# Patient Record
Sex: Female | Born: 1990 | Hispanic: Yes | Marital: Single
Health system: Midwestern US, Community
[De-identification: ages and names within clinical notes are randomized; demographics above are authoritative.]

## PROBLEM LIST (undated history)

## (undated) MED ORDER — AMOXICILLIN-POT CLAVULANATE 400 MG-57 MG/5 ML ORAL SUSP
400-57 mg/5 mL | ORAL | Status: DC
Start: ? — End: 2014-03-31

## (undated) MED ORDER — AMOXICILLIN CLAVULANATE 875 MG-125 MG TAB
875-125 mg | ORAL_TABLET | Freq: Two times a day (BID) | ORAL | Status: AC
Start: ? — End: 2012-08-08

---

## 2012-01-01 LAB — HCG URINE, QL: HCG urine, QL: NEGATIVE

## 2012-01-01 MED ORDER — IBUPROFEN 600 MG TAB
600 mg | ORAL | Status: DC
Start: 2012-01-01 — End: 2012-01-01

## 2012-01-01 MED ORDER — IBUPROFEN 600 MG TAB
600 mg | ORAL_TABLET | Freq: Four times a day (QID) | ORAL | Status: DC | PRN
Start: 2012-01-01 — End: 2014-03-31

## 2012-01-01 NOTE — ED Notes (Signed)
Pain to left foot after wood fell on it last week and today a lap top fell on it

## 2012-01-01 NOTE — ED Notes (Signed)
Pt seen & evaluated by Marcelle Smiling NP, Motrin 600mg  PO given & tolerated, x-ray ordered, pending urine HCG results, no visible distress noted.

## 2012-01-01 NOTE — ED Notes (Signed)
Ace bandage applied  Discharge in stable condition, f/u care instructions given, verbalize understanding, no visible distress.

## 2012-01-01 NOTE — ED Provider Notes (Addendum)
HPI Comments: Patient reports that a piece of wood fell onto her right foot a week ago and had pain at that time. She reports that her laptop fell onto her right foot this am. Reports pain 4/10. To right foot. Denies weakness, numbness or tingling.     Patient is a 21 y.o. female presenting with foot injury. The history is provided by the patient.   Foot Injury   This is a new problem. The pain is at a severity of 6/10. Pertinent negatives include no numbness, full range of motion, no stiffness, no tingling, no itching, no back pain and no neck pain.        Past Medical History   Diagnosis Date   ??? Psychiatric disorder    ??? Anxiety    ??? Depression         History reviewed. No pertinent past surgical history.      History reviewed. No pertinent family history.     History     Social History   ??? Marital Status: SINGLE     Spouse Name: N/A     Number of Children: N/A   ??? Years of Education: N/A     Occupational History   ??? Not on file.     Social History Main Topics   ??? Smoking status: Former Smoker   ??? Smokeless tobacco: Not on file   ??? Alcohol Use: Yes   ??? Drug Use: No   ??? Sexually Active:      Other Topics Concern   ??? Not on file     Social History Narrative   ??? No narrative on file                  ALLERGIES: Prednisone      Review of Systems   HENT: Negative for neck pain.    Respiratory: Negative.    Cardiovascular: Negative.    Gastrointestinal: Negative.    Musculoskeletal: Positive for myalgias and arthralgias. Negative for back pain, joint swelling, gait problem and stiffness.   Skin: Positive for wound. Negative for color change, itching, pallor and rash.        Small abrasion to dorsal aspect of right foot    Neurological: Negative.  Negative for tingling and numbness.       Filed Vitals:    01/01/12 1205   BP: 113/74   Pulse: 78   Temp: 98.1 ??F (36.7 ??C)   Resp: 16   Height: 5\' 3"  (1.6 m)   Weight: 90.719 kg (200 lb)   SpO2: 98%            Physical Exam   Constitutional: She is oriented to person, place,  and time.   Pulmonary/Chest: Effort normal.   Abdominal: Soft.   Musculoskeletal: She exhibits tenderness. She exhibits no edema.   Neurological: She is alert and oriented to person, place, and time. She displays normal reflexes. No cranial nerve deficit. Coordination normal.   Skin: Skin is warm.        MDM     Amount and/or Complexity of Data Reviewed:    Obtain history from someone other than the patient:  No      Procedures    <EMERGENCY DEPARTMENT CASE SUMMARY>    Impression/Differential Diagnosis: right foot pain    Plan: xray right foot     ED Course: d/c with motrin and f/u with Orthopedic MD if pain persists     Final Impression/Diagnosis: right foot pain  Patient condition at time of disposition:stable       I have reviewed the following home medications:    Prior to Admission medications    Medication Sig Start Date End Date Taking? Authorizing Provider   citalopram (CELEXA) 10 mg tablet Take  by mouth daily.   Yes Phys Other, MD   QUEtiapine (SEROQUEL) 25 mg tablet Take  by mouth daily.   Yes Phys Other, MD         Mickel Duhamel, NP

## 2012-01-01 NOTE — ED Notes (Signed)
X-rays done & read.

## 2012-01-19 NOTE — ED Provider Notes (Signed)
I was personally available for consultation in the emergency department.  I have reviewed the chart and agree with the documentation recorded by the MLP, including the assessment, treatment plan, and disposition.  Braley Luckenbaugh R. Montasia Chisenhall, MD

## 2012-07-28 NOTE — ED Provider Notes (Addendum)
HPI Comments: Patient to ED complains of generalized body aches, dry hacking cough, head and chest congestion, and sore throat for the past 3 weeks.   Patient was initially seen by her primary MD for URI symptoms, was prescribed cough syrup and told she had a cold.  Patient completed the bottle of cough syrup, did not improve and in fact feels worse.     Patient is a 22 y.o. female presenting with cough and sore throat. The history is provided by the patient.   Cough  This is a new problem. The current episode started more than 1 week ago. The problem has not changed since onset.The cough is non-productive. There has been a fever of 100 - 100.9 F. Associated symptoms include chest pain, ear pain, headaches, rhinorrhea, sore throat and myalgias. Pertinent negatives include no chills, no sweats, no eye redness, no ear congestion, no shortness of breath, no nausea and no vomiting. She has tried cough syrup for the symptoms. The treatment provided no relief. She is not a smoker.   Sore Throat   This is a new problem. The current episode started more than 2 days ago. The problem has been gradually worsening. There has been a fever of 100 - 100.9 F. Associated symptoms include congestion, ear pain, headaches, swollen glands and cough. Pertinent negatives include no vomiting, no drooling, no shortness of breath, no stridor, no trouble swallowing and no stiff neck. She has had no exposure to strep or mono. She has tried NSAIDs for the symptoms. The treatment provided mild relief.        Past Medical History   Diagnosis Date   ??? Psychiatric disorder    ??? Anxiety    ??? Depression         History reviewed. No pertinent past surgical history.      History reviewed. No pertinent family history.     History     Social History   ??? Marital Status: SINGLE     Spouse Name: N/A     Number of Children: N/A   ??? Years of Education: N/A     Occupational History   ??? Not on file.     Social History Main Topics   ??? Smoking status: Former  Smoker   ??? Smokeless tobacco: Not on file   ??? Alcohol Use: Yes   ??? Drug Use: No   ??? Sexually Active:      Other Topics Concern   ??? Not on file     Social History Narrative   ??? No narrative on file                  ALLERGIES: Prednisone      Review of Systems   Constitutional: Positive for fever, appetite change and fatigue. Negative for chills, diaphoresis and activity change.   HENT: Positive for ear pain, congestion, sore throat, rhinorrhea, postnasal drip and sinus pressure. Negative for hearing loss, facial swelling, drooling, mouth sores, trouble swallowing, neck pain, neck stiffness, voice change and tinnitus.    Eyes: Negative for redness.   Respiratory: Positive for cough. Negative for shortness of breath and stridor.    Cardiovascular: Positive for chest pain.   Gastrointestinal: Positive for abdominal pain. Negative for nausea, vomiting and abdominal distention.        Generalized abdominal cramping    Genitourinary: Negative for dysuria and flank pain.   Musculoskeletal: Positive for myalgias. Negative for back pain and joint swelling.   Skin: Negative.  Allergic/Immunologic: Negative for immunocompromised state.   Neurological: Positive for headaches. Negative for dizziness and weakness.   All other systems reviewed and are negative.        Filed Vitals:    07/28/12 2012   BP: 123/73   Pulse: 90   Temp: 100.6 ??F (38.1 ??C)   Resp: 18   Height: 5\' 3"  (1.6 m)   Weight: 88.451 kg (195 lb)   SpO2: 100%            Physical Exam   Nursing note and vitals reviewed.  Constitutional: She is oriented to person, place, and time. She appears well-developed and well-nourished. No distress.   HENT:   Head: Normocephalic and atraumatic.   Right Ear: External ear normal.   Left Ear: External ear normal.   Nose: Nose normal.   Mouth/Throat: Uvula is midline and mucous membranes are normal. Oropharyngeal exudate, posterior oropharyngeal erythema and tonsillar abscesses present. No posterior oropharyngeal edema.   Eyes:  Conjunctivae and EOM are normal. Pupils are equal, round, and reactive to light. No scleral icterus.   Neck: Trachea normal, normal range of motion and phonation normal. Neck supple. No spinous process tenderness and no muscular tenderness present. No tracheal deviation present.   Cardiovascular: Normal rate, regular rhythm, intact distal pulses and normal pulses.    Pulmonary/Chest: Effort normal and breath sounds normal. No accessory muscle usage or stridor. No respiratory distress. She has no decreased breath sounds. She has no wheezes. She has no rhonchi. She has no rales. She exhibits no tenderness.   Abdominal: Soft. Normal appearance and bowel sounds are normal. She exhibits no distension and no mass. There is generalized tenderness. There is no rebound, no guarding and no CVA tenderness.   Musculoskeletal: Normal range of motion. She exhibits no edema and no tenderness.   Lymphadenopathy:        Head (right side): Submandibular and tonsillar adenopathy present.        Head (left side): Submandibular and tonsillar adenopathy present.     She has no cervical adenopathy.   Neurological: She is alert and oriented to person, place, and time. She has normal strength. No cranial nerve deficit or sensory deficit. GCS eye subscore is 4. GCS verbal subscore is 5. GCS motor subscore is 6.   Skin: Skin is warm and dry. No rash noted. She is not diaphoretic.   Psychiatric: She has a normal mood and affect. Her speech is normal and behavior is normal. Judgment and thought content normal. Cognition and memory are normal.        MDM     Amount and/or Complexity of Data Reviewed:   Clinical lab tests:  Ordered and reviewed  Tests in the radiology section of CPT??:  Ordered   Decide to obtain previous medical records or to obtain history from someone other than the patient:  Yes   Obtain history from someone other than the patient:  Yes   Review and summarize past medical records:  Yes      Procedures    <EMERGENCY DEPARTMENT  CASE SUMMARY>    Impression/Differential Diagnosis:  Strep pharyngitis, mono, pneumonia, flu, viral syndrome    Plan:  u preg, strep- rapid and throat c/s, flu swab, mono spot, Ebstein Barr panel     ED Course: Patient feels somewhat better in ED. Awaiting mono spot results.   11:16 PM  Report to [employer] PA. If mono spot negative will treat for strep pharyngitis based on clinical assessment  Final Impression/Diagnosis: TBD    Patient condition at time of disposition: TBD       I have reviewed the following home medications:    Prior to Admission medications    Medication Sig Start Date End Date Taking? Authorizing Provider   citalopram (CELEXA) 10 mg tablet Take  by mouth daily.   Yes Phys Other, MD   QUEtiapine (SEROQUEL) 25 mg tablet Take  by mouth daily.   Yes Phys Other, MD   ibuprofen (MOTRIN) 600 mg tablet Take 1 Tab by mouth every six (6) hours as needed for Pain. 01/01/12  Yes Mickel Duhamel, NP         Levonne Hubert, NP      Case reviewed with Dr. Nicholos Johns  who agrees with the presented plan, treatment, and disposition.     I was personally available for consultation in the emergency department.  I have reviewed the chart and agree with the documentation recorded by the [facility], including the assessment, treatment plan, and disposition.  Agustin Cree, MD

## 2012-07-28 NOTE — ED Notes (Signed)
White patches noted throat. NAD noted.

## 2012-07-28 NOTE — ED Notes (Signed)
Pt sore throat temp, boby aches and cough for about 3 weeks

## 2012-07-29 LAB — STREP AG SCREEN, GROUP A: Group A Strep Ag ID: NEGATIVE

## 2012-07-29 LAB — INFLUENZA A & B AG (RAPID TEST)
Comment: NEGATIVE
Influenza A Ag: NEGATIVE
Influenza B Ag: NEGATIVE

## 2012-07-29 LAB — MONONUCLEOSIS SCREEN: Mononucleosis screen: NONREACTIVE

## 2012-07-29 NOTE — ED Notes (Signed)
Pt is discharged to home. No distress observed. Denies pain. Instructions reviewed and verbalized understanding. Safety maintained.

## 2012-07-29 NOTE — ED Notes (Cosign Needed)
Labs:  Recent Results (from the past 12 hour(s))   INFLUENZA A & B AG (RAPID TEST)    Collection Time     07/28/12  9:15 PM       Result Value Range    Influenza A Ag NEGATIVE       Influenza B Ag NEGATIVE       Method IMMUNOCHROMATOGRAPHIC MEMBRANE ASSAY      Comment Result: Negative for Influenza A and B     STREP AG SCREEN, GROUP A    Collection Time     07/28/12  9:15 PM       Result Value Range    Group A Strep Ag ID NEGATIVE       Method IMMUNOCHROMATOGRAPHIC MEMBRANE ASSAY     INFECTIOUS MONO SCREEN    Collection Time     07/28/12 11:30 PM       Result Value Range    Mononucleosis screen NONREACTIVE  NONREACTIVE       EKG:    Radiology:     Re-evaluations:    Consults:    <EMERGENCY DEPARTMENT CASE SUMMARY>    Impression/Differential Diagnosis: strep pharyngitis vs. Mono vs. Pneumonia vs. Viral syndrome    Plan: discharged, follow up with pcp, given augmentin RX    ED Course: u preg, rapid strep-neg, rapid flu - neg, mono spot - neg, ebv panel, patient to be discharged    Final Impression/Diagnosis: pharyngitis    Patient condition at time of disposition: stable      I have reviewed the following home medications:    Prior to Admission medications    Medication Sig Start Date End Date Taking? Authorizing Provider   citalopram (CELEXA) 10 mg tablet Take  by mouth daily.   Yes Phys Other, MD   QUEtiapine (SEROQUEL) 25 mg tablet Take  by mouth daily.   Yes Phys Other, MD   ibuprofen (MOTRIN) 600 mg tablet Take 1 Tab by mouth every six (6) hours as needed for Pain. 01/01/12  Yes Mickel Duhamel, NP       Sandford Craze, PA

## 2012-07-29 NOTE — ED Notes (Signed)
Pt remained on stretcher. No distress observed. Awaiting for disposition. Safety maintained.

## 2012-07-29 NOTE — ED Notes (Cosign Needed)
Patient cannot tolerate swallowing pill therefore I wrote a prescription for liquid augmentin.    Judah Carchi RPA-C

## 2012-07-30 LAB — EPSTEIN BARR VIRUS AB PANEL
EBV Ab VCA, IgG: >8 AI — ABNORMAL HIGH (ref 0.0–0.8)
EBV Ab VCA, IgM: <0.2 AI (ref 0.0–0.8)
EBV Early Antigen Ab, IgG: <0.2 AI (ref 0.0–0.8)
EBV Nuclear Antigen Ab, IgG: 1.5 AI — ABNORMAL HIGH (ref 0.0–0.8)

## 2012-07-31 LAB — THROAT CULTURE

## 2012-07-31 NOTE — Progress Notes (Signed)
Quick Note:    Patient on augmentin for strep.  ______

## 2014-03-31 ENCOUNTER — Inpatient Hospital Stay
Admit: 2014-03-31 | Discharge: 2014-04-01 | Disposition: A | Discharge status: Home / Self Care | Payer: Medicaid HMO | Attending: Internal Medicine

## 2014-03-31 ENCOUNTER — Emergency Department: Admit: 2014-04-01 | Payer: Medicaid HMO

## 2014-03-31 DIAGNOSIS — O2392 Unspecified genitourinary tract infection in pregnancy, second trimester: Secondary | ICD-10-CM
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
## 2014-03-31 NOTE — ED Notes (Signed)
Patient felt better. Discharged with instructions.

## 2014-04-01 LAB — URINALYSIS W/ RFLX MICROSCOPIC
Bilirubin: NEGATIVE
Blood: NEGATIVE
Glucose: NEGATIVE mg/dL
Ketone: 160 mg/dL — AB
Nitrites: NEGATIVE
Protein: NEGATIVE mg/dL
Specific gravity: 1.024 (ref 1.003–1.030)
Urobilinogen: 1 EU/dL (ref 0.2–1.0)
pH (UA): 6 (ref 4.6–8.0)

## 2014-04-01 MED ORDER — NITROFURANTOIN (25% MACROCRYSTAL FORM) 100 MG CAP
100 mg | ORAL_CAPSULE | Freq: Two times a day (BID) | ORAL | Status: AC
Start: 2014-04-01 — End: 2014-04-05

## 2014-04-01 MED ORDER — NITROFURANTOIN (25% MACROCRYSTAL FORM) 100 MG CAP
100 mg | Freq: Two times a day (BID) | ORAL | Status: DC
Start: 2014-04-01 — End: 2014-04-01
  Administered 2014-04-01: 01:00:00 via ORAL

## 2014-04-01 MED FILL — NITROFURANTOIN (25% MACROCRYSTAL FORM) 100 MG CAP: 100 mg | ORAL | Qty: 1

## 2014-04-02 LAB — CULTURE, URINE
Culture result:: <10000
Culture: <10000

## 2015-06-28 IMAGING — US US OB TRANSVAGINAL
1 series · 14 of 28 positions shown · non-contrast
Comparison: None.

CLINICAL DATA: Pelvic pain. Back pain. 6 week 0 day gestational age
by LMP.

EXAM:
OBSTETRIC <14 WK US AND TRANSVAGINAL OB US
TECHNIQUE: Both transabdominal and transvaginal ultrasound examinations were
performed for complete evaluation of the gestation as well as the
maternal uterus, adnexal regions, and pelvic cul-de-sac.
Transvaginal technique was performed to assess early pregnancy.

[Series 1: us ob comp less 14 wks · 14 of 42 slices shown]
[im 2/42]
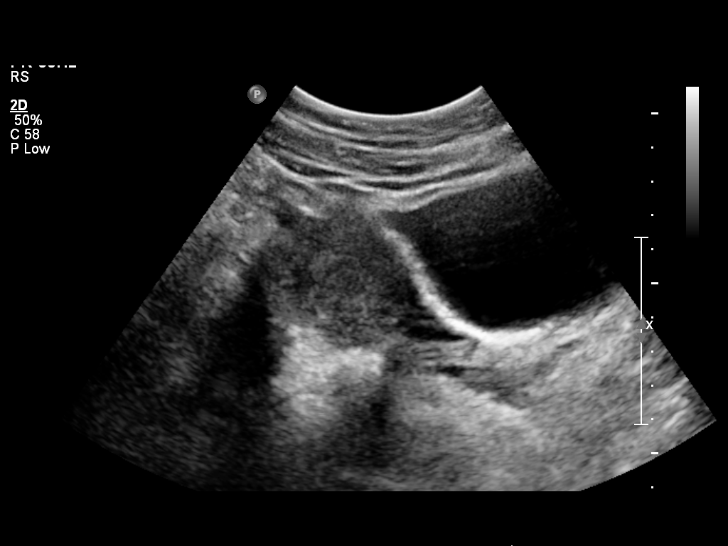
[im 5/42]
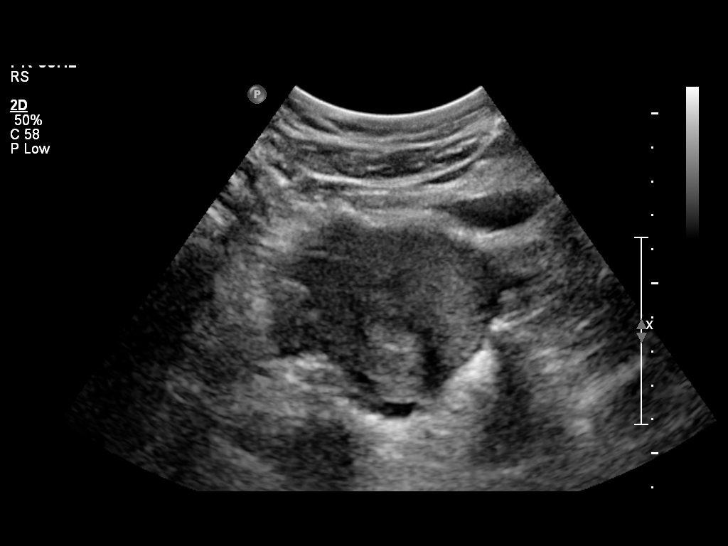
[im 8/42]
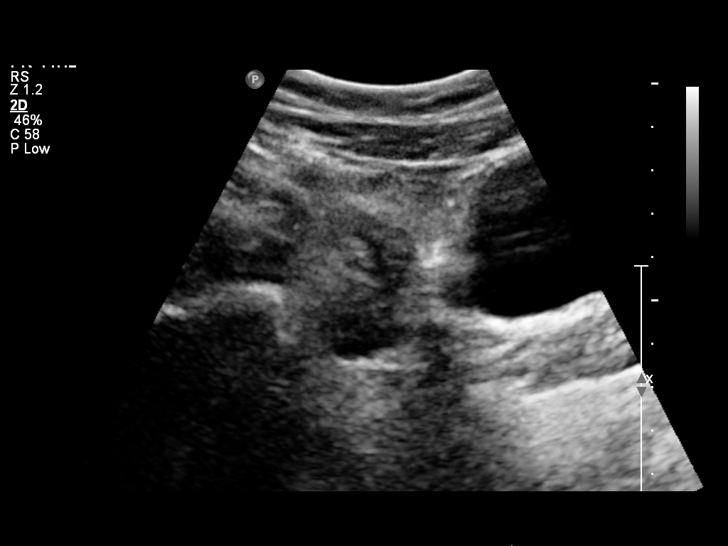
[im 11/42]
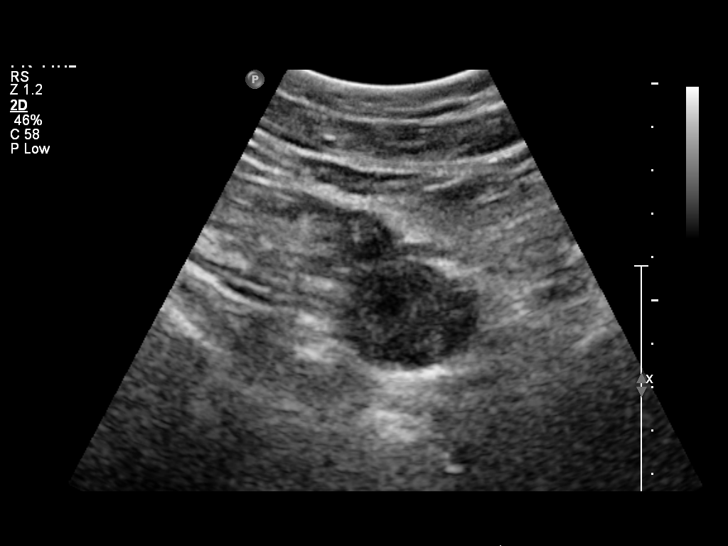
[im 14/42]
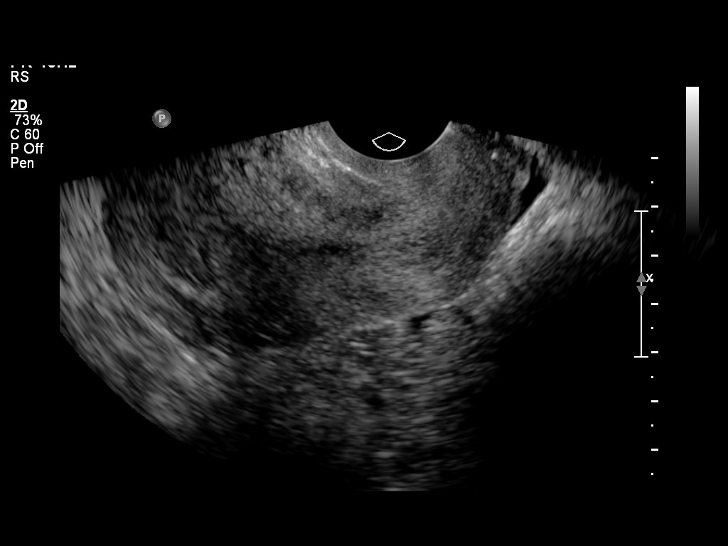
[im 17/42]
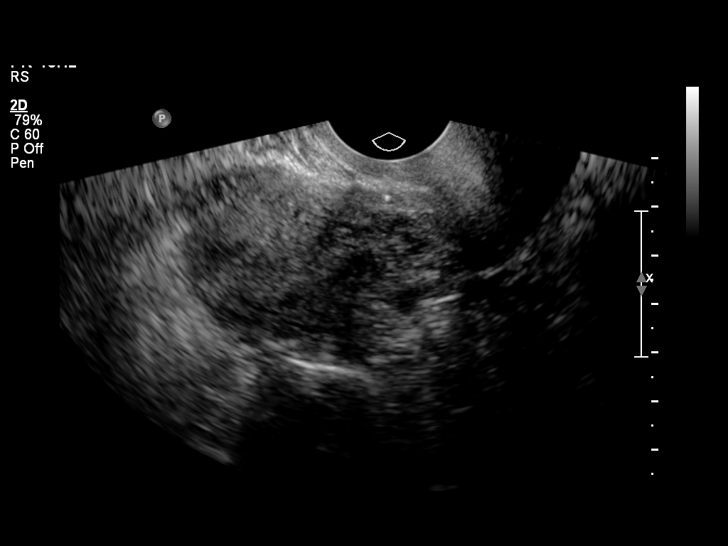
[im 20/42]
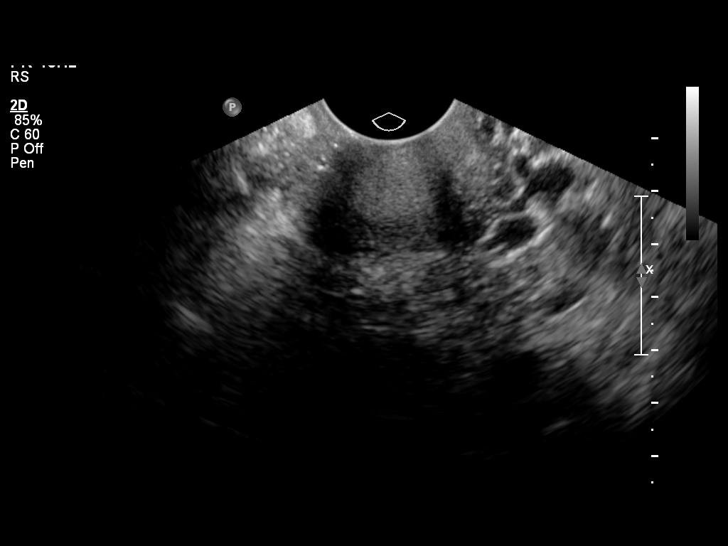
[im 23/42]
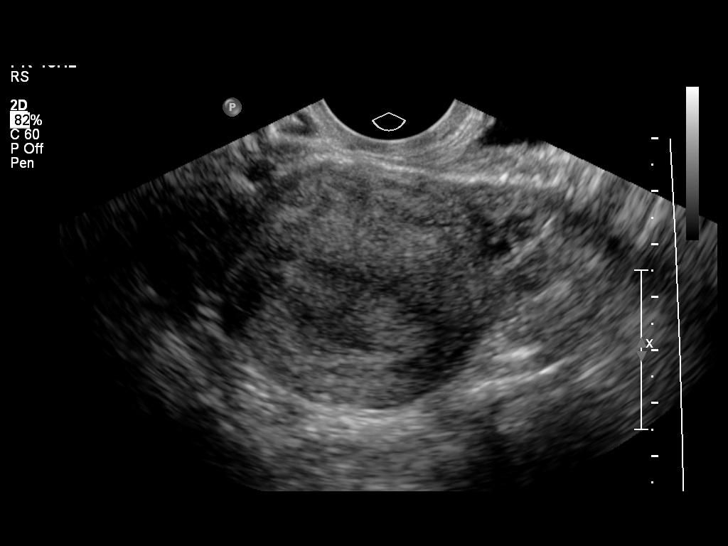
[im 26/42]
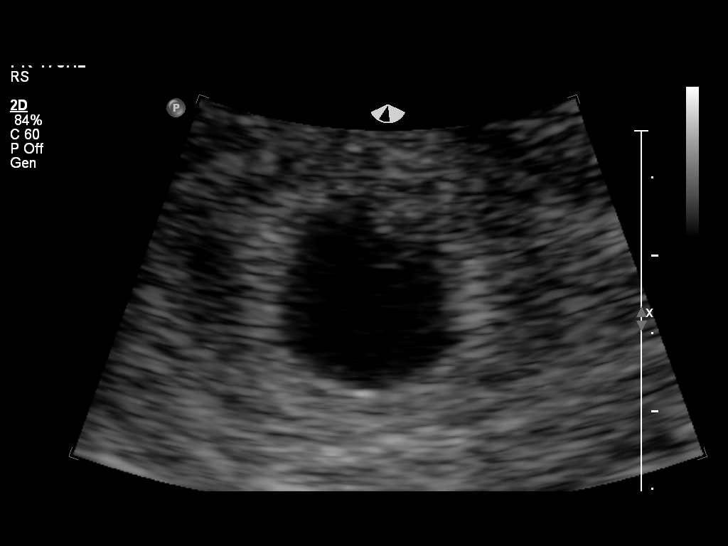
[im 29/42]
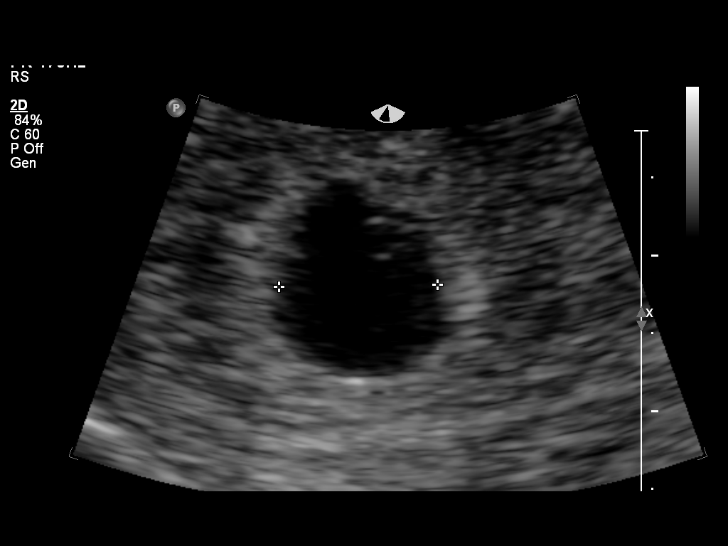
[im 32/42]
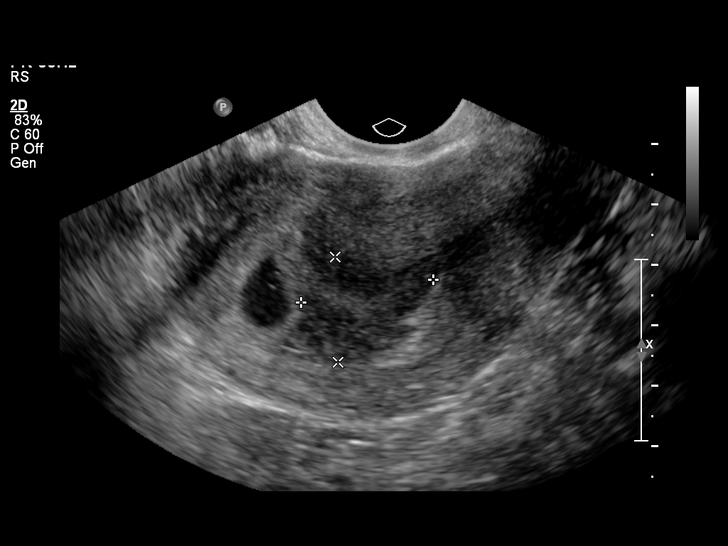
[im 35/42]
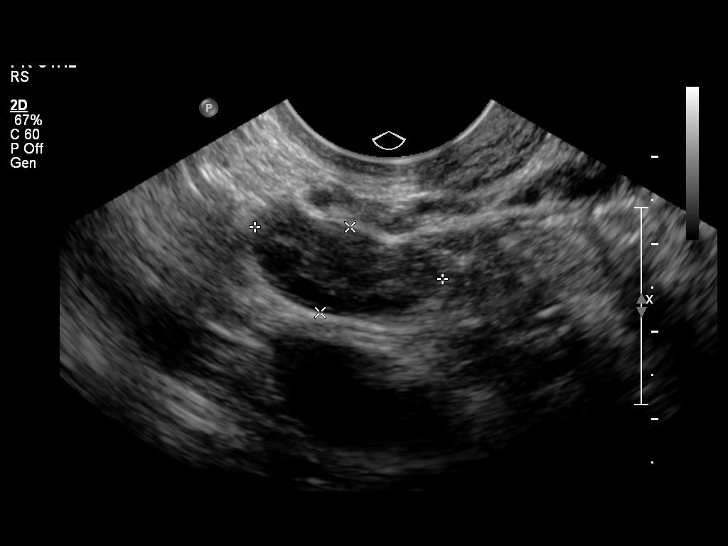
[im 38/42]
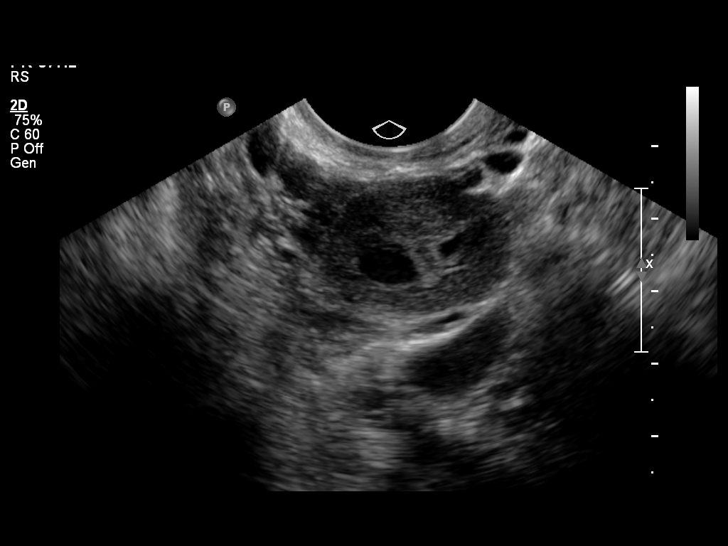
[im 42/42]
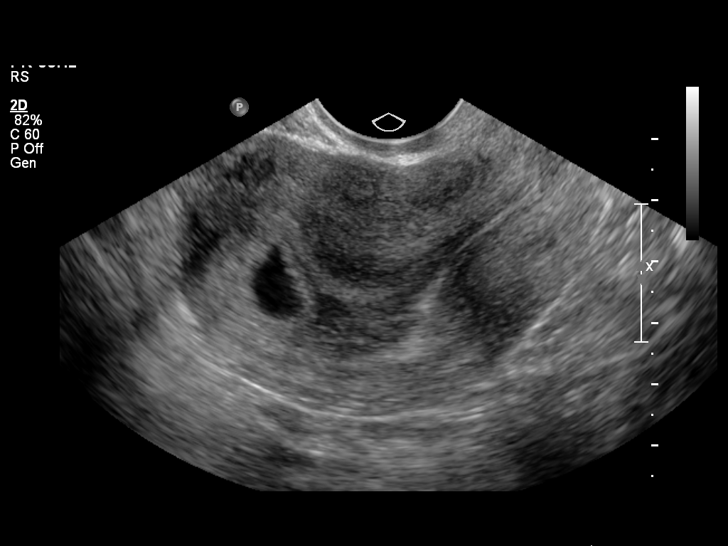

[14 of 28 positions shown; findings below may reference images not displayed]

FINDINGS: Intrauterine gestational sac: Visualized/normal in shape.

Yolk sac:  Visualized

Embryo:  Not visualized

MSD:  11  mm   5 w   5  d

Maternal uterus/adnexae: No fibroids or other significant
abnormality seen involving the uterus. Both ovaries are normal in
appearance. No adnexal mass or free fluid identified.
IMPRESSION: Single intrauterine gestational sac measuring 5 weeks 5 days by mean
sac diameter, which is concordant with LMP.

No significant maternal uterine or adnexal abnormality identified.

## 2021-08-22 ENCOUNTER — Emergency Department: Admit: 2021-08-23 | Payer: Medicaid HMO | Primary: Family Medicine

## 2021-08-22 ENCOUNTER — Inpatient Hospital Stay
Admit: 2021-08-22 | Discharge: 2021-08-23 | Disposition: A | Discharge status: Home / Self Care | Payer: Medicaid HMO | Attending: Emergency Medicine

## 2021-08-22 DIAGNOSIS — N83202 Unspecified ovarian cyst, left side: Secondary | ICD-10-CM

## 2021-08-22 DIAGNOSIS — N8302 Follicular cyst of left ovary: Secondary | ICD-10-CM

## 2021-08-22 MED ORDER — SODIUM CHLORIDE 0.9% BOLUS IV
0.9 % | Freq: Once | INTRAVENOUS | Status: AC
Start: 2021-08-22 — End: 2021-08-22
  Administered 2021-08-22: via INTRAVENOUS

## 2021-08-22 MED FILL — SODIUM CHLORIDE 0.9 % IV: INTRAVENOUS | Qty: 1000

## 2021-08-22 NOTE — ED Notes (Signed)
 Pt s/p eval & tx by provider  Cyst of left ovary. Pt medicated as ordered with no adverse reaction noted. Pt reports  noted pain relief . Discharge Dispo given to pt and reviewed . Pt verbalized understanding of instructions provided. Pt ambulated off unit with family. No s/s of distress upon departure.

## 2021-08-22 NOTE — ED Provider Notes (Signed)
ED Provider Notes by Sonnie Alamo, FNP at 08/22/21 1806                Author: Sonnie Alamo, FNP  Service: Emergency Medicine  Author Type: Nurse Practitioner       Filed: 08/22/21 2124  Date of Service: 08/22/21 1806  Status: Attested           Editor: Sonnie Alamo, [place] (Nurse Practitioner)  Cosigner: Johnnye Sima, DO at 08/29/21 1517          Attestation signed by Johnnye Sima, DO at 08/29/21 [phone]          I was available for consultation in the department at the time of evaluation and reviewed the chart of the midlevel provider per institutional protocol.   Johnnye Sima, DO                                 6:06 PM: Regina Silva is a 31 y.o. female with h/o anxiety, depression, overactive bladder, psychiatric disorder who presents to the ED for evaluation of left sided pelvic pain  and nausea x2 weeks.  Reports had a miscarriage about 4 weeks ago.  State had an IUD which was taken out yesterday.  Denies fever, chills, vomiting, diarrhea, chest pain, shortness of breath or other complaints.        Past Medical History:        Diagnosis  Date         ?  Anxiety       ?  Depression       ?  Overactive bladder           ?  Psychiatric disorder             History reviewed. No pertinent surgical history.        Social History          Tobacco Use         ?  Smoking status:  Former     ?  Smokeless tobacco:  Not on file       Substance Use Topics         ?  Alcohol use:  Yes             Allergies        Allergen  Reactions         ?  Prednisone  Other (comments)             psychosis              Review of Systems    Constitutional:  Positive for activity change and appetite change . Negative for chills, diaphoresis, fatigue, fever and unexpected weight change.    Eyes:  Negative for visual disturbance.    Respiratory:  Negative for cough, chest tightness and shortness of breath.     Cardiovascular:  Negative for chest pain, palpitations and leg swelling.    Gastrointestinal:  Positive for  abdominal pain and nausea . Negative for abdominal distention, anal bleeding, blood in stool, constipation, diarrhea, rectal pain and vomiting.    Genitourinary:  Negative for dysuria.    Musculoskeletal:  Negative for arthralgias, back pain, gait problem, joint swelling, myalgias, neck pain and neck stiffness.    Skin:  Negative for color change, pallor, rash and wound.    Neurological:  Negative for weakness and numbness.  Visit Vitals      BP  109/74     Pulse  (!) 56     Temp  97.1 ??F (36.2 ??C)     Resp  16     Ht  '5\' 3"'  (1.6 m)     Wt  81.6 kg (180 lb)     SpO2  99%        BMI  31.89 kg/m??           Physical Exam   Vitals and nursing note reviewed.    Constitutional:        General: She is awake. She is not in acute distress.      Appearance: Normal appearance. She is well-developed. She is not ill-appearing, toxic-appearing or diaphoretic.       Interventions: She is not intubated.   HENT:       Head: Normocephalic and atraumatic.       Mouth/Throat:       Mouth: Mucous membranes are moist.    Eyes:       Extraocular Movements: Extraocular movements intact.       Pupils: Pupils are equal, round, and reactive to light.    Neck:       Trachea: Trachea and phonation normal.    Cardiovascular:       Rate and Rhythm: Normal rate and regular rhythm.       Pulses: Normal pulses. No decreased pulses.       Heart sounds: Normal heart sounds. No murmur heard.   Pulmonary:       Effort: Pulmonary effort is normal. No tachypnea, bradypnea, accessory muscle usage, prolonged expiration, respiratory distress or retractions. She is not intubated.       Breath sounds: Normal breath sounds and air entry . No stridor, decreased air movement or transmitted upper airway sounds.     Abdominal:       General: Bowel sounds are normal. There is no distension.       Palpations: Abdomen is soft. There is no mass.       Tenderness: There is abdominal tenderness in the left lower quadrant . There is no right CVA tenderness, left CVA  tenderness, guarding or rebound.       Hernia: No hernia is present.     Musculoskeletal:          General: Normal range of motion.       Cervical back: Full passive range of motion without pain, normal range of motion and neck supple. No edema, erythema, signs of trauma, rigidity, torticollis or crepitus. No pain with movement, spinous process tenderness or muscular tenderness. Normal  range of motion.    Skin:      General: Skin is warm and dry.       Capillary Refill: Capillary refill takes less than 2 seconds.       Findings: No rash.    Neurological:       General: No focal deficit present.       Mental Status: She is alert and oriented to person, place, and time. Mental status is at baseline.       GCS: GCS eye subscore is 4. GCS verbal subscore is 5 . GCS motor subscore is 6.       Cranial Nerves: Cranial nerves 2-12 are intact. No cranial nerve deficit.       Sensory: Sensation is intact. No sensory deficit.       Motor: Motor function is intact.  Coordination: Coordination is intact.       Gait: Gait is intact.    Psychiatric:          Attention and Perception: Attention and perception normal.          Mood and Affect: Mood and affect normal.          Speech: Speech normal.          Behavior: Behavior normal. Behavior is cooperative.          Thought Content: Thought content normal.          Cognition and Memory: Cognition and memory normal.          Judgment: Judgment normal.           Labs:     Recent Results (from the past 24 hour(s))     METABOLIC PANEL, COMPREHENSIVE          Collection Time: 08/22/21  6:28 PM         Result  Value  Ref Range            Sodium  140  136 - 145 mmol/L       Potassium  4.4  3.4 - 5.1 mmol/L       Chloride  106  98 - 107 mmol/L       CO2  28  20.0 - 31.0 mmol/L       Anion gap  11  10 - 20 mmol/L       Glucose  78  74 - 106 mg/dL       BUN  17  9 - 23 mg/dL       Creatinine  0.71  0.55 - 1.02 mg/dL       GFR est AA  >60  >60 ml/min/1.51m       GFR est non-AA  >60   >60 ml/min/1.73m      Calcium  9.0  8.7 - 10.4 mg/dL       Bilirubin, total  0.2 (L)  0.3 - 1.2 mg/dL       ALT (SGPT)  12  10 - 49 U/L       AST (SGOT)  13  0 - 33.9 U/L       Alk. phosphatase  43 (L)  46 - 116 U/L       Protein, total  6.5  5.7 - 8.2 g/dL       Albumin  3.6  3.48 - 5.0 g/dL       Globulin  2.9  1.7 - 4.7 g/dL       A-G Ratio  1.2  0.7 - 2.8         LIPASE          Collection Time: 08/22/21  6:28 PM         Result  Value  Ref Range            Lipase  35  12 - 53 U/L       CBC WITH AUTOMATED DIFF          Collection Time: 08/22/21  6:28 PM         Result  Value  Ref Range            WBC  6.8  4.8 - 10.6 K/uL       RBC  4.14 (L)  4.20 - 5.40 M/uL       HGB  12.8  12.0 - 16.0  g/dL       HCT  39.2  36.0 - 47.0 %       MCV  94.7 (H)  81.0 - 94.0 FL       MCH  30.9  27.0 - 35.0 PG       MCHC  32.7  30.7 - 37.3 g/dL       RDW  12.2  11.5 - 14.0 %       PLATELET  246  130 - 400 K/uL       MPV  10.4  9.2 - 11.8 FL       NRBC  0.0  0 PER 100 WBC       ABSOLUTE NRBC  0.00  0.0 - 0.01 K/uL       NEUTROPHILS  53  48.0 - 72.0 %       LYMPHOCYTES  38  18.0 - 40.0 %       MONOCYTES  7  2.0 - 12.0 %       EOSINOPHILS  2  0.0 - 7.0 %       BASOPHILS  1  0.0 - 3.0 %       IMMATURE GRANULOCYTES  0  0 - 0.5 %       ABS. NEUTROPHILS  3.6  2.3 - 7.6 K/UL       ABS. LYMPHOCYTES  2.6  0.9 - 4.2 K/UL       ABS. MONOCYTES  0.5  0.1 - 1.7 K/UL       ABS. EOSINOPHILS  0.1  0.0 - 1.0 K/UL       ABS. BASOPHILS  0.1  0.0 - 0.4 K/UL       ABS. IMM. GRANS.  0.0  0.0 - 0.17 K/UL       DF  AUTOMATED          PTT          Collection Time: 08/22/21  6:28 PM         Result  Value  Ref Range            aPTT  27.2  23.90 - 30.70 SEC       PROTHROMBIN TIME + INR          Collection Time: 08/22/21  6:28 PM         Result  Value  Ref Range            Prothrombin time  10.3  9.50 - 12.10 sec            INR  1.0              EKG:          Radiology:   [place] PELV NON OBS      Result Date: 08/22/2021   EXAM: [place] PELV NON OBS INDICATION: Evaluate for  LT ovarian cyst. Pelvic pain, left greater than right. COMPARISON:  None. TECHNIQUE: Grayscale and color duplex Doppler evaluation of the pelvic structures was performed transabdominally only. FINDINGS: LMP:  08/02/2021. The urinary bladder is not visualized. The uterus is anteverted  and measures 9.8 x 4.2 x 6.2 cm in sagittal, AP and transverse dimensions respectively. The uterine contour is smooth and the myometrium is homogenous. No focal uterine lesions  are noted. The endometrium measures 0.8 cm in thickness and is unremarkable. The right ovary measures 4.4 x 2.9 x 2.9 cm. 2.0 x 1.9 x 2.0 cm follicle in the right ovary. Otherwise  unremarkable right ovary. The left ovary measures 3.6 x 2.2 x 3.3 cm. Multiple  peripheral follicles noted in the left ovary. Otherwise unremarkable left ovary. Color duplex Doppler evaluation of both ovaries show normal arterial and venous waveforms. No ovarian torsion seen. No abnormal masses, free fluid or fluid collections are  noted.       Normal study. Small follicle in the right ovary and multiple peripheral cysts or follicles in the left ovary. Otherwise unremarkable ovaries. No ovarian torsion seen. Otherwise unremarkable study. Electronically signed by:  Laurina Bustle MD  08/22/2021  07:56 PM EST Workstation: [id number]             Procedures       <EMERGENCY DEPARTMENT CASE SUMMARY>      Impression/Differential Diagnosis: Ovarian cyst, UTI         MDM   Number of Diagnoses or Management Options   Cyst of left ovary: new, needed workup       Amount and/or Complexity of Data Reviewed   Clinical lab tests: ordered and reviewed   Tests in the radiology section of CPT?? : ordered and reviewed    Discussion of test results with the performing providers: no   Decide to obtain previous medical records or to obtain history from someone  other than the patient: yes   Obtain history from someone other than the patient: no   Review and summarize past medical records: yes   Discuss  the patient with other providers:  no   Independent visualization of images, tracings, or specimens:  yes      Risk of Complications, Morbidity, and/or Mortality   General comments: Patient  came into ED for evaluation of left lower quadrant abdominal pain.  Reports having a history of PCOS.  States she was seen by her OB/GYN and had her IUD removed today.  States had a miscarriage last month.  Labs and ultrasound resulted and reviewed.   Per my independent interpretation: Labs are unremarkable.  No signs of infection noted.  No SIRS criteria is noted.  Pending urinalysis.  Ultrasound shows multiple peripheral follicular cyst to left ovary.  Patient reports having a history of PCOS.  I  discussed lab results and ultrasound results at bedside with patient.  Patient complains of headache at this time.  Will order Toradol, Benadryl and Reglan IV.  Signout to Lucretia Field, PA for reevaluation after medication administration and patient is  pending urinalysis study.                ED Course:    31 y.o. female presented to the ED for evaluation of left lower quadrant abdominal pain and nausea x2 weeks.  Reports having a miscarriage last month.      Plan: Labs, urinalysis, urine pregnancy, IV fluid, Zofran IV, ultrasound, observation      8:29 PM: Labs and ultrasound resulted and reviewed.  Per my independent interpretation: Labs are unremarkable.  No signs of infection noted.  No SIRS criteria is noted.  Pending urinalysis.  Ultrasound shows multiple peripheral follicular cyst to left  ovary.  Patient reports having a history of PCOS.  I discussed lab results and ultrasound results at bedside with patient.  Patient complains of headache at this time.  Will order Toradol, Benadryl and Reglan IV.      9:21 PM: Report given to Lucretia Field, PA continue 80 of care.  Patient is pending urinalysis and reevaluation status post medication administration.  Final Impression/Diagnosis:      Encounter Diagnoses               ICD-10-CM  ICD-9-CM          1.  Cyst of left ovary   N83.202  620.2           Patient condition at time of disposition: Stable         I have reviewed the following home medications:        Prior to Admission medications             Medication  Sig  Start Date  End Date  Taking?  Authorizing Provider            naproxen sodium (ANAPROX) 550 mg tablet  Take 1 Tablet by mouth two (2) times daily (with meals). As needed for pain  08/22/21    Yes  Sonnie Alamo, FNP     OXYBUTYNIN CHLORIDE PO  Take  by mouth.        Other, Phys, MD            SERTRALINE HCL (ZOLOFT PO)  Take  by mouth.    08/22/21    Other, Phys, MD                 Sonnie Alamo, FNP

## 2021-08-22 NOTE — ED Notes (Signed)
Pt  returned from Ultrasound. Pt awaiting  Dispo. No s/s of distress noted.

## 2021-08-22 NOTE — ED Notes (Signed)
C/o L sided pelvic pain, nausea x2 weeks.     LMP 08/02/21. Last BM today.

## 2021-08-22 NOTE — ED Notes (Signed)
 Pt discharged with instructions, pt verbalized understanding of instruction.  No visible distress noted, ambulates with steady gait without assistance.

## 2021-08-22 NOTE — ED Notes (Deleted)
2150    patient signed out to me by NP and GU to follow-up on urinalysis.  My independent interpretation shows no clinically significant abnormalities.  Patient to be discharged to follow-up with her primary care doctor.

## 2021-08-23 LAB — URINALYSIS W/ RFLX MICROSCOPIC
Bilirubin, Urine: NEGATIVE
Bilirubin: NEGATIVE
Blood, Urine: NEGATIVE
Blood: NEGATIVE
Glucose, Ur: NEGATIVE mg/dL
Glucose: NEGATIVE mg/dL
Ketone: NEGATIVE mg/dL
Ketones, Urine: NEGATIVE mg/dL
Leukocyte Esterase, Urine: NEGATIVE
Leukocyte Esterase: NEGATIVE
Nitrite, Urine: NEGATIVE
Nitrites: NEGATIVE
Protein, UA: NEGATIVE mg/dL
Protein: NEGATIVE mg/dL
Specific Gravity, UA: 1.022 (ref 1.005–1.030)
Specific gravity: 1.022 (ref 1.005–1.030)
Urobilinogen, UA, POCT: 0.2 EU/dL (ref 0.2–1.0)
Urobilinogen: 0.2 EU/dL (ref 0.2–1.0)
pH (UA): 5.5
pH, UA: 5.5

## 2021-08-23 LAB — CBC WITH AUTO DIFFERENTIAL
Basophils %: 1 % (ref 0.0–3.0)
Basophils Absolute: 0.1 10*3/uL (ref 0.0–0.4)
Eosinophils %: 2 % (ref 0.0–7.0)
Eosinophils Absolute: 0.1 10*3/uL (ref 0.0–1.0)
Granulocyte Absolute Count: 0 10*3/uL (ref 0.0–0.17)
Hematocrit: 39.2 % (ref 36.0–47.0)
Hemoglobin: 12.8 g/dL (ref 12.0–16.0)
Immature Granulocytes: 0 % (ref 0–0.5)
Lymphocytes %: 38 % (ref 18.0–40.0)
Lymphocytes Absolute: 2.6 10*3/uL (ref 0.9–4.2)
MCH: 30.9 PG (ref 27.0–35.0)
MCHC: 32.7 g/dL (ref 30.7–37.3)
MCV: 94.7 FL — ABNORMAL HIGH (ref 81.0–94.0)
MPV: 10.4 FL (ref 9.2–11.8)
Monocytes %: 7 % (ref 2.0–12.0)
Monocytes Absolute: 0.5 10*3/uL (ref 0.1–1.7)
NRBC Absolute: 0 10*3/uL (ref 0.0–0.01)
Neutrophils %: 53 % (ref 48.0–72.0)
Neutrophils Absolute: 3.6 10*3/uL (ref 2.3–7.6)
Nucleated RBCs: 0 PER 100 WBC
Platelets: 246 10*3/uL (ref 130–400)
RBC: 4.14 M/uL — ABNORMAL LOW (ref 4.20–5.40)
RDW: 12.2 % (ref 11.5–14.0)
WBC: 6.8 10*3/uL (ref 4.8–10.6)

## 2021-08-23 LAB — COMPREHENSIVE METABOLIC PANEL
ALT: 12 U/L (ref 10–49)
AST: 13 U/L (ref 0–33.9)
Albumin/Globulin Ratio: 1.2 (ref 0.7–2.8)
Albumin: 3.6 g/dL (ref 3.48–5.0)
Alkaline Phosphatase: 43 U/L — ABNORMAL LOW (ref 46–116)
Anion Gap: 11 mmol/L (ref 10–20)
BUN: 17 mg/dL (ref 9–23)
CO2: 28 mmol/L (ref 20.0–31.0)
Calcium: 9 mg/dL (ref 8.7–10.4)
Chloride: 106 mmol/L (ref 98–107)
Creatinine: 0.71 mg/dL (ref 0.55–1.02)
EGFR IF NonAfrican American: >60 mL/min/{1.73_m2} (ref >60)
GFR African American: >60 mL/min/{1.73_m2} (ref >60)
Globulin: 2.9 g/dL (ref 1.7–4.7)
Glucose: 78 mg/dL (ref 74–106)
Potassium: 4.4 mmol/L (ref 3.4–5.1)
Sodium: 140 mmol/L (ref 136–145)
Total Bilirubin: 0.2 mg/dL — ABNORMAL LOW (ref 0.3–1.2)
Total Protein: 6.5 g/dL (ref 5.7–8.2)

## 2021-08-23 LAB — LIPASE
Lipase: 35 U/L (ref 12–53)
Lipase: 35 U/L (ref 12–53)

## 2021-08-23 LAB — PROTIME-INR
INR: 1
Protime: 10.3 s (ref 9.50–12.10)

## 2021-08-23 LAB — APTT: aPTT: 27.2 s (ref 23.90–30.70)

## 2021-08-23 LAB — METABOLIC PANEL, COMPREHENSIVE
A-G Ratio: 1.2 (ref 0.7–2.8)
ALT (SGPT): 12 U/L (ref 10–49)
AST (SGOT): 13 U/L (ref 0–33.9)
Albumin: 3.6 g/dL (ref 3.48–5.0)
Alk. phosphatase: 43 U/L — ABNORMAL LOW (ref 46–116)
Anion gap: 11 mmol/L (ref 10–20)
BUN: 17 mg/dL (ref 9–23)
Bilirubin, total: 0.2 mg/dL — ABNORMAL LOW (ref 0.3–1.2)
CO2: 28 mmol/L (ref 20.0–31.0)
Calcium: 9 mg/dL (ref 8.7–10.4)
Chloride: 106 mmol/L (ref 98–107)
Creatinine: 0.71 mg/dL (ref 0.55–1.02)
GFR est AA: >60 mL/min/{1.73_m2} (ref >60)
GFR est non-AA: >60 mL/min/{1.73_m2} (ref >60)
Globulin: 2.9 g/dL (ref 1.7–4.7)
Glucose: 78 mg/dL (ref 74–106)
Potassium: 4.4 mmol/L (ref 3.4–5.1)
Protein, total: 6.5 g/dL (ref 5.7–8.2)
Sodium: 140 mmol/L (ref 136–145)

## 2021-08-23 LAB — CBC WITH AUTOMATED DIFF
ABS. BASOPHILS: 0.1 10*3/uL (ref 0.0–0.4)
ABS. EOSINOPHILS: 0.1 10*3/uL (ref 0.0–1.0)
ABS. IMM. GRANS.: 0 10*3/uL (ref 0.0–0.17)
ABS. LYMPHOCYTES: 2.6 10*3/uL (ref 0.9–4.2)
ABS. MONOCYTES: 0.5 10*3/uL (ref 0.1–1.7)
ABS. NEUTROPHILS: 3.6 10*3/uL (ref 2.3–7.6)
ABSOLUTE NRBC: 0 10*3/uL (ref 0.0–0.01)
BASOPHILS: 1 % (ref 0.0–3.0)
EOSINOPHILS: 2 % (ref 0.0–7.0)
HCT: 39.2 % (ref 36.0–47.0)
HGB: 12.8 g/dL (ref 12.0–16.0)
IMMATURE GRANULOCYTES: 0 % (ref 0–0.5)
LYMPHOCYTES: 38 % (ref 18.0–40.0)
MCH: 30.9 PG (ref 27.0–35.0)
MCHC: 32.7 g/dL (ref 30.7–37.3)
MCV: 94.7 FL — ABNORMAL HIGH (ref 81.0–94.0)
MONOCYTES: 7 % (ref 2.0–12.0)
MPV: 10.4 FL (ref 9.2–11.8)
NEUTROPHILS: 53 % (ref 48.0–72.0)
NRBC: 0 PER 100 WBC
PLATELET: 246 10*3/uL (ref 130–400)
RBC: 4.14 M/uL — ABNORMAL LOW (ref 4.20–5.40)
RDW: 12.2 % (ref 11.5–14.0)
WBC: 6.8 10*3/uL (ref 4.8–10.6)

## 2021-08-23 LAB — PROTHROMBIN TIME + INR
INR: 1
Prothrombin time: 10.3 s (ref 9.50–12.10)

## 2021-08-23 LAB — PTT: aPTT: 27.2 s (ref 23.90–30.70)

## 2021-08-23 MED ORDER — KETOROLAC TROMETHAMINE 30 MG/ML INJECTION
30 mg/mL (1 mL) | INTRAMUSCULAR | Status: AC
Start: 2021-08-23 — End: 2021-08-22
  Administered 2021-08-23: 02:00:00 via INTRAVENOUS

## 2021-08-23 MED ORDER — ONDANSETRON (PF) 4 MG/2 ML INJECTION
4 mg/2 mL | INTRAMUSCULAR | Status: DC
Start: 2021-08-23 — End: 2021-08-22

## 2021-08-23 MED ORDER — METOCLOPRAMIDE 5 MG/ML IJ SOLN
5 mg/mL | INTRAMUSCULAR | Status: AC
Start: 2021-08-23 — End: 2021-08-22
  Administered 2021-08-23: 02:00:00 via INTRAVENOUS

## 2021-08-23 MED ORDER — SODIUM CHLORIDE 0.9% BOLUS IV
0.9 % | Freq: Once | INTRAVENOUS | Status: AC
Start: 2021-08-23 — End: 2021-08-22
  Administered 2021-08-23: 02:00:00 via INTRAVENOUS

## 2021-08-23 MED ORDER — NAPROXEN SODIUM 550 MG TAB
550 mg | ORAL_TABLET | Freq: Two times a day (BID) | ORAL | 0 refills | Status: AC
Start: 2021-08-23 — End: ?

## 2021-08-23 MED ORDER — DIPHENHYDRAMINE HCL 50 MG/ML IJ SOLN
50 mg/mL | Freq: Once | INTRAMUSCULAR | Status: AC
Start: 2021-08-23 — End: 2021-08-22
  Administered 2021-08-23: 02:00:00 via INTRAVENOUS

## 2021-08-23 MED FILL — DIPHENHYDRAMINE HCL 50 MG/ML IJ SOLN: 50 mg/mL | INTRAMUSCULAR | Qty: 1

## 2021-08-23 MED FILL — SODIUM CHLORIDE 0.9 % IV: INTRAVENOUS | Qty: 1000

## 2021-08-23 MED FILL — METOCLOPRAMIDE 5 MG/ML IJ SOLN: 5 mg/mL | INTRAMUSCULAR | Qty: 2

## 2021-08-23 MED FILL — KETOROLAC TROMETHAMINE 30 MG/ML INJECTION: 30 mg/mL (1 mL) | INTRAMUSCULAR | Qty: 1
